# Patient Record
Sex: Male | Born: 1970 | Hispanic: Yes | Marital: Single | Smoking: Never smoker
Health system: Southern US, Community
[De-identification: ages and names within clinical notes are randomized; demographics above are authoritative.]

---

## 2006-10-20 ENCOUNTER — Emergency Department (HOSPITAL_COMMUNITY): Admission: EM | Admit: 2006-10-20 | Discharge: 2006-10-20 | Payer: Self-pay | Admitting: Emergency Medicine

## 2009-02-10 ENCOUNTER — Emergency Department (HOSPITAL_COMMUNITY): Admission: EM | Admit: 2009-02-10 | Discharge: 2009-02-10 | Payer: Self-pay | Admitting: Emergency Medicine

## 2009-02-25 ENCOUNTER — Emergency Department (HOSPITAL_COMMUNITY): Admission: EM | Admit: 2009-02-25 | Discharge: 2009-02-25 | Payer: Self-pay | Admitting: Family Medicine

## 2010-02-01 ENCOUNTER — Emergency Department (HOSPITAL_COMMUNITY): Admission: EM | Admit: 2010-02-01 | Discharge: 2010-02-01 | Payer: Self-pay | Admitting: Emergency Medicine

## 2010-04-02 ENCOUNTER — Ambulatory Visit (HOSPITAL_COMMUNITY): Admission: RE | Admit: 2010-04-02 | Discharge: 2010-04-02 | Payer: Self-pay | Admitting: Chiropractic Medicine

## 2010-12-07 LAB — POCT I-STAT, CHEM 8
Calcium, Ion: 1.15 mmol/L (ref 1.12–1.32)
Chloride: 104 mEq/L (ref 96–112)
TCO2: 25 mmol/L (ref 0–100)

## 2010-12-07 LAB — URINALYSIS, ROUTINE W REFLEX MICROSCOPIC
Bilirubin Urine: NEGATIVE
Glucose, UA: NEGATIVE mg/dL
Hgb urine dipstick: NEGATIVE
Ketones, ur: NEGATIVE mg/dL
Nitrite: NEGATIVE
Specific Gravity, Urine: 1.015 (ref 1.005–1.030)
Urobilinogen, UA: 1 mg/dL (ref 0.0–1.0)

## 2014-02-26 ENCOUNTER — Emergency Department (HOSPITAL_COMMUNITY): Payer: Self-pay

## 2014-02-26 ENCOUNTER — Emergency Department (HOSPITAL_COMMUNITY)
Admission: EM | Admit: 2014-02-26 | Discharge: 2014-02-26 | Disposition: A | Discharge status: Home / Self Care | Payer: Self-pay | Attending: Emergency Medicine | Admitting: Emergency Medicine

## 2014-02-26 ENCOUNTER — Encounter (HOSPITAL_COMMUNITY): Payer: Self-pay | Admitting: Emergency Medicine

## 2014-02-26 DIAGNOSIS — S1093XA Contusion of unspecified part of neck, initial encounter: Principal | ICD-10-CM

## 2014-02-26 DIAGNOSIS — Z79899 Other long term (current) drug therapy: Secondary | ICD-10-CM | POA: Insufficient documentation

## 2014-02-26 DIAGNOSIS — Y92410 Unspecified street and highway as the place of occurrence of the external cause: Secondary | ICD-10-CM | POA: Insufficient documentation

## 2014-02-26 DIAGNOSIS — S0083XA Contusion of other part of head, initial encounter: Principal | ICD-10-CM | POA: Insufficient documentation

## 2014-02-26 DIAGNOSIS — Y9389 Activity, other specified: Secondary | ICD-10-CM | POA: Insufficient documentation

## 2014-02-26 DIAGNOSIS — S0003XA Contusion of scalp, initial encounter: Secondary | ICD-10-CM | POA: Insufficient documentation

## 2014-02-26 MED ORDER — IBUPROFEN 600 MG PO TABS: 600.0000 mg | ORAL_TABLET | Freq: Three times a day (TID) | ORAL | Status: AC

## 2014-02-26 MED ORDER — TRAMADOL HCL 50 MG PO TABS: 50.0000 mg | ORAL_TABLET | Freq: Four times a day (QID) | ORAL | Status: DC | PRN

## 2014-02-26 NOTE — ED Notes (Signed)
Patient states that h is head, neck and back is hurting. Pain level is 5

## 2014-02-26 NOTE — ED Notes (Signed)
C collar off by MD.  
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
## 2016-07-28 ENCOUNTER — Encounter (HOSPITAL_COMMUNITY): Payer: Self-pay | Admitting: *Deleted

## 2016-07-28 ENCOUNTER — Emergency Department (HOSPITAL_COMMUNITY): Payer: Self-pay

## 2016-07-28 DIAGNOSIS — R0602 Shortness of breath: Secondary | ICD-10-CM | POA: Insufficient documentation

## 2016-07-28 DIAGNOSIS — J189 Pneumonia, unspecified organism: Secondary | ICD-10-CM | POA: Insufficient documentation

## 2016-07-28 LAB — CBC
HEMATOCRIT: 42.6 % (ref 39.0–52.0)
Hemoglobin: 14.6 g/dL (ref 13.0–17.0)
MCH: 29.7 pg (ref 26.0–34.0)
MCHC: 34.3 g/dL (ref 30.0–36.0)
MCV: 86.8 fL (ref 78.0–100.0)
PLATELETS: 267 10*3/uL (ref 150–400)
RBC: 4.91 MIL/uL (ref 4.22–5.81)
RDW: 13.4 % (ref 11.5–15.5)
WBC: 11.6 10*3/uL — AB (ref 4.0–10.5)

## 2016-07-28 LAB — I-STAT TROPONIN, ED: Troponin i, poc: 0 ng/mL (ref 0.00–0.08)

## 2016-07-28 LAB — BASIC METABOLIC PANEL
Anion gap: 7 (ref 5–15)
BUN: 12 mg/dL (ref 6–20)
CHLORIDE: 104 mmol/L (ref 101–111)
CO2: 26 mmol/L (ref 22–32)
CREATININE: 0.84 mg/dL (ref 0.61–1.24)
Calcium: 9.1 mg/dL (ref 8.9–10.3)
Glucose, Bld: 105 mg/dL — ABNORMAL HIGH (ref 65–99)
POTASSIUM: 3.8 mmol/L (ref 3.5–5.1)
SODIUM: 137 mmol/L (ref 135–145)

## 2016-07-28 NOTE — ED Triage Notes (Signed)
Pt c/o chest pain, productive cough , and headache x 2 weeks. Reports pain worsens at night. Also reports fever and chills

## 2016-07-29 ENCOUNTER — Emergency Department (HOSPITAL_COMMUNITY)
Admission: EM | Admit: 2016-07-29 | Discharge: 2016-07-29 | Disposition: A | Discharge status: Home / Self Care | Payer: Self-pay | Attending: Emergency Medicine | Admitting: Emergency Medicine

## 2016-07-29 DIAGNOSIS — R05 Cough: Secondary | ICD-10-CM

## 2016-07-29 DIAGNOSIS — R059 Cough, unspecified: Secondary | ICD-10-CM

## 2016-07-29 DIAGNOSIS — J189 Pneumonia, unspecified organism: Secondary | ICD-10-CM

## 2016-07-29 LAB — BRAIN NATRIURETIC PEPTIDE: B NATRIURETIC PEPTIDE 5: 9.2 pg/mL (ref 0.0–100.0)

## 2016-07-29 MED ORDER — IPRATROPIUM BROMIDE 0.02 % IN SOLN
0.5000 mg | Freq: Once | RESPIRATORY_TRACT | Status: AC
  Administered 2016-07-29: 0.5 mg via RESPIRATORY_TRACT
  Filled 2016-07-29: qty 2.5

## 2016-07-29 MED ORDER — BENZONATATE 100 MG PO CAPS: 100.0000 mg | ORAL_CAPSULE | Freq: Three times a day (TID) | ORAL | 0 refills | Status: AC

## 2016-07-29 MED ORDER — ALBUTEROL SULFATE (2.5 MG/3ML) 0.083% IN NEBU
5.0000 mg | INHALATION_SOLUTION | Freq: Once | RESPIRATORY_TRACT | Status: AC
  Administered 2016-07-29: 5 mg via RESPIRATORY_TRACT
  Filled 2016-07-29: qty 6

## 2016-07-29 MED ORDER — ALBUTEROL SULFATE HFA 108 (90 BASE) MCG/ACT IN AERS
2.0000 | INHALATION_SPRAY | RESPIRATORY_TRACT | Status: DC | PRN
  Administered 2016-07-29: 2 via RESPIRATORY_TRACT
  Filled 2016-07-29: qty 6.7

## 2016-07-29 MED ORDER — AZITHROMYCIN 250 MG PO TABS: 250.0000 mg | ORAL_TABLET | Freq: Every day | ORAL | 0 refills | Status: AC

## 2016-07-29 MED ORDER — DEXAMETHASONE 10 MG/ML FOR PEDIATRIC ORAL USE
10.0000 mg | Freq: Once | INTRAMUSCULAR | Status: AC
  Administered 2016-07-29: 10 mg via ORAL
  Filled 2016-07-29: qty 1

## 2016-07-29 MED ORDER — AZITHROMYCIN 250 MG PO TABS
500.0000 mg | ORAL_TABLET | Freq: Once | ORAL | Status: AC
  Administered 2016-07-29: 500 mg via ORAL
  Filled 2016-07-29: qty 2

## 2016-07-29 MED ORDER — FLUTICASONE PROPIONATE 50 MCG/ACT NA SUSP
2.0000 | Freq: Once | NASAL | Status: AC
  Administered 2016-07-29: 2 via NASAL
  Filled 2016-07-29: qty 16

## 2016-07-29 MED ORDER — AEROCHAMBER PLUS W/MASK MISC
1.0000 | Freq: Once | Status: AC
  Administered 2016-07-29: 1
  Filled 2016-07-29: qty 1

## 2016-07-29 NOTE — ED Notes (Signed)
Pt given extended instructions regarding aero chamber and MDI usage.

## 2016-07-29 NOTE — Discharge Instructions (Signed)
1. Medications: flonase, OTC mucinex, tessalon, albuterol, usual home medications °2. Treatment: rest, drink plenty of fluids, take tylenol or ibuprofen for fever control °3. Follow Up: Please followup with your primary doctor in 3 days for discussion of your diagnoses and further evaluation after today's visit; if you do not have a primary care doctor use the resource guide provided to find one; Return to the ER for high fevers, difficulty breathing or other concerning symptoms ° °

## 2016-07-29 NOTE — ED Provider Notes (Signed)
[facility] Provider Note   CSN: [id number] Arrival date & time: 07/28/16  2010     History   Chief Complaint Chief Complaint  Patient presents with  . Fever  . Chest Pain  . Cough    HPI Billy Short is a 45 y.o. male with no major medical problems presents to the Emergency Department complaining of gradual, persistent, progressively worsening cough onset 3 weeks ago. Associated symptoms include subjective fevers, SOB, mild DOE related to increased cough, CP, generalized headache.  Pt denies leg swelling, recent travel, hx of DVT, surgery or fracture.  No known sick contacts.  No treatments PTA.  Pt is a never smoker.  Nothing makes it better and walking, coughing makes it worse.  Pt denies neck pain, neck stiffness, vision changes, abd pain, N/V/D, dysuria, hematuria, syncope, numbness, tingling.      The history is provided by the patient and medical records. No language interpreter was used.    History reviewed. No pertinent past medical history.  There are no active problems to display for this patient.   History reviewed. No pertinent surgical history.     Home Medications    Prior to Admission medications   Medication Sig Start Date End Date Taking? Authorizing Provider  azithromycin (ZITHROMAX) 250 MG tablet Take 1 tablet (250 mg total) by mouth daily. Take first 2 tablets together, then 1 every day until finished. 07/29/16   Sayaka Hoeppner, PA-C  benzonatate (TESSALON) 100 MG capsule Take 1 capsule (100 mg total) by mouth every 8 (eight) hours. 07/29/16   Hisashi Amadon, PA-C    Family History No family history on file.  Social History Social History  Substance Use Topics  . Smoking status: Never Smoker  . Smokeless tobacco: Never Used  . Alcohol use No     Allergies   Patient has no known allergies.   Review of Systems Review of Systems  Constitutional: Positive for fever.  HENT: Positive for congestion, rhinorrhea and  sinus pressure.   Eyes: Negative for visual disturbance.  Respiratory: Positive for cough and shortness of breath.   Cardiovascular: Positive for chest pain. Negative for palpitations and leg swelling.  Neurological: Positive for headaches.  All other systems reviewed and are negative.    Physical Exam Updated Vital Signs BP (!) 96/47 (BP Location: Right Arm)   Pulse 68   Temp 98 F (36.7 C) (Oral)   Resp 15   SpO2 99%   Physical Exam  Constitutional: He appears well-developed and well-nourished. No distress.  Awake, alert, nontoxic appearance  HENT:  Head: Normocephalic and atraumatic.  Right Ear: Tympanic membrane, external ear and ear canal normal.  Left Ear: Tympanic membrane, external ear and ear canal normal.  Nose: Mucosal edema and rhinorrhea present. No epistaxis. Right sinus exhibits no maxillary sinus tenderness and no frontal sinus tenderness. Left sinus exhibits no maxillary sinus tenderness and no frontal sinus tenderness.  Mouth/Throat: Uvula is midline, oropharynx is clear and moist and mucous membranes are normal. Mucous membranes are not pale and not cyanotic. No oropharyngeal exudate, posterior oropharyngeal edema, posterior oropharyngeal erythema or tonsillar abscesses.  Eyes: Conjunctivae are normal. Pupils are equal, round, and reactive to light. No scleral icterus.  Neck: Normal range of motion and full passive range of motion without pain. Neck supple.  Cardiovascular: Normal rate, regular rhythm and intact distal pulses.   Pulmonary/Chest: Effort normal and breath sounds normal. No stridor. No respiratory distress. He has no wheezes.  Clear and equal breath  sounds without focal wheezes, rhonchi, rales  Abdominal: Soft. Bowel sounds are normal. He exhibits no mass. There is no tenderness. There is no rebound and no guarding.  Musculoskeletal: Normal range of motion. He exhibits no edema.  Lymphadenopathy:    He has no cervical adenopathy.  Neurological: He  is alert.  Speech is clear and goal oriented Moves extremities without ataxia  Skin: Skin is warm and dry. No rash noted. He is not diaphoretic.  Psychiatric: He has a normal mood and affect.  Nursing note and vitals reviewed.    ED Treatments / Results  Labs (all labs ordered are listed, but only abnormal results are displayed) Labs Reviewed  BASIC METABOLIC PANEL - Abnormal; Notable for the following:       Result Value   Glucose, Bld 105 (*)    All other components within normal limits  CBC - Abnormal; Notable for the following:    WBC 11.6 (*)    All other components within normal limits  BRAIN NATRIURETIC PEPTIDE  Rosezena Sensor, ED     Radiology Dg Chest 2 View  Result Date: 07/28/2016 CLINICAL DATA:  Subacute onset of cough and fever. Initial encounter. EXAM: CHEST  2 VIEW COMPARISON:  Chest radiograph performed 02/01/2010 FINDINGS: The lungs are well-aerated. Vascular congestion is noted. Increased interstitial markings raise concern for mild interstitial edema. There is no evidence of pleural effusion or pneumothorax. The heart is borderline normal in size. No acute osseous abnormalities are seen. IMPRESSION: Vascular congestion noted. Increased interstitial markings raise concern for mild interstitial edema. Electronically Signed   By: Roanna Raider M.D.   On: 07/28/2016 22:19    Procedures Procedures (including critical care time)  Medications Ordered in ED Medications  albuterol (PROVENTIL HFA;VENTOLIN HFA) 108 (90 Base) MCG/ACT inhaler 2 puff (2 puffs Inhalation Given 07/29/16 0540)  albuterol (PROVENTIL) (2.5 MG/3ML) 0.083% nebulizer solution 5 mg (5 mg Nebulization Given 07/29/16 0321)  ipratropium (ATROVENT) nebulizer solution 0.5 mg (0.5 mg Nebulization Given 07/29/16 0321)  fluticasone (FLONASE) 50 MCG/ACT nasal spray 2 spray (2 sprays Each Nare Given 07/29/16 0325)  dexamethasone (DECADRON) 10 MG/ML injection for Pediatric ORAL use 10 mg (10 mg Oral  Given 07/29/16 0325)  azithromycin (ZITHROMAX) tablet 500 mg (500 mg Oral Given 07/29/16 0321)  aerochamber plus with mask device 1 each (1 each Other Given 07/29/16 0540)     Initial Impression / Assessment and Plan / ED Course  I have reviewed the triage vital signs and the nursing notes.  Pertinent labs & imaging results that were available during my care of the patient were reviewed by me and considered in my medical decision making (see chart for details).  Clinical Course as of Jul 29 630  Thu Jul 29, 2016  0622 Mild leukocytosis WBC: (!) 11.6 [HM]  0622 Troponin i, poc: 0.00 [HM]  0622 Within normal limits B Natriuretic Peptide: 9.2 [HM]  0622 No evidence of pneumonia DG Chest 2 View [HM]    Clinical Course User Index [HM] Ashlee Player, PA-C    Pt CXR negative for acute infiltrate, However symptoms are consistent with pneumonia acquired pneumonia. Will treat with azithromycin. Significant improvement in breathing after albuterol treatment.  No hypoxia. Patient well-appearing.. Pt will be discharged with symptomatic treatment.  Verbalizes understanding and is agreeable with plan. Pt is hemodynamically stable & in NAD prior to dc.   Final Clinical Impressions(s) / ED Diagnoses   Final diagnoses:  Cough  Community acquired pneumonia, unspecified laterality  New Prescriptions Discharge Medication List as of 07/29/2016  5:17 AM    START taking these medications   Details  azithromycin (ZITHROMAX) 250 MG tablet Take 1 tablet (250 mg total) by mouth daily. Take first 2 tablets together, then 1 every day until finished., Starting Thu 07/29/2016, Print    benzonatate (TESSALON) 100 MG capsule Take 1 capsule (100 mg total) by mouth every 8 (eight) hours., Starting Thu 07/29/2016, [employer]         Kilea Mccarey, PA-C 07/29/16 [id number]    Tomasita CrumbleAdeleke Oni, MD 07/29/16 [id number]

## 2017-09-19 IMAGING — DX DG CHEST 2V
2 series · 2 of 2 positions shown · non-contrast
Comparison: Chest radiograph performed 02/01/2010

CLINICAL DATA: Subacute onset of cough and fever. Initial
encounter.

EXAM:
CHEST  2 VIEW

[w chest pa]
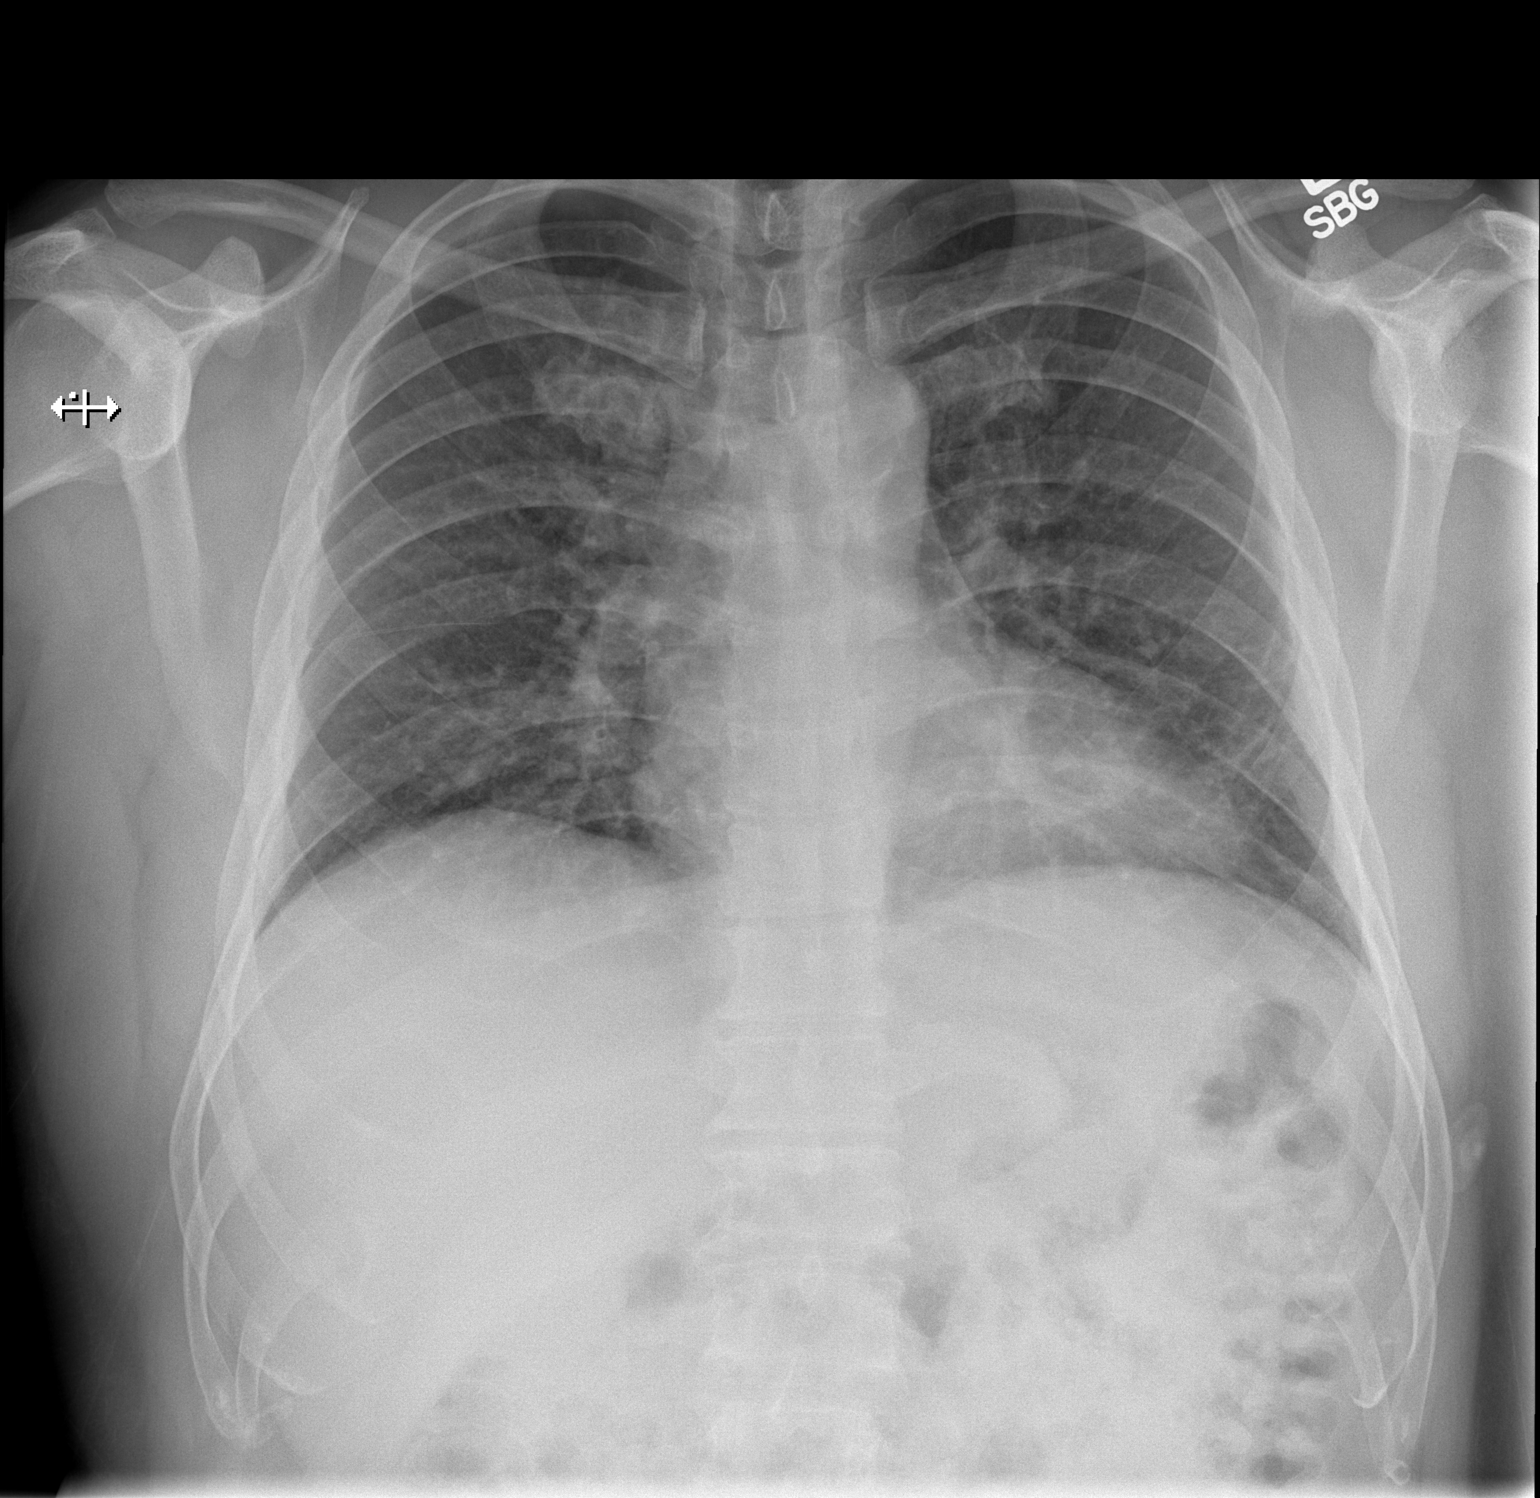

[w chest lat]
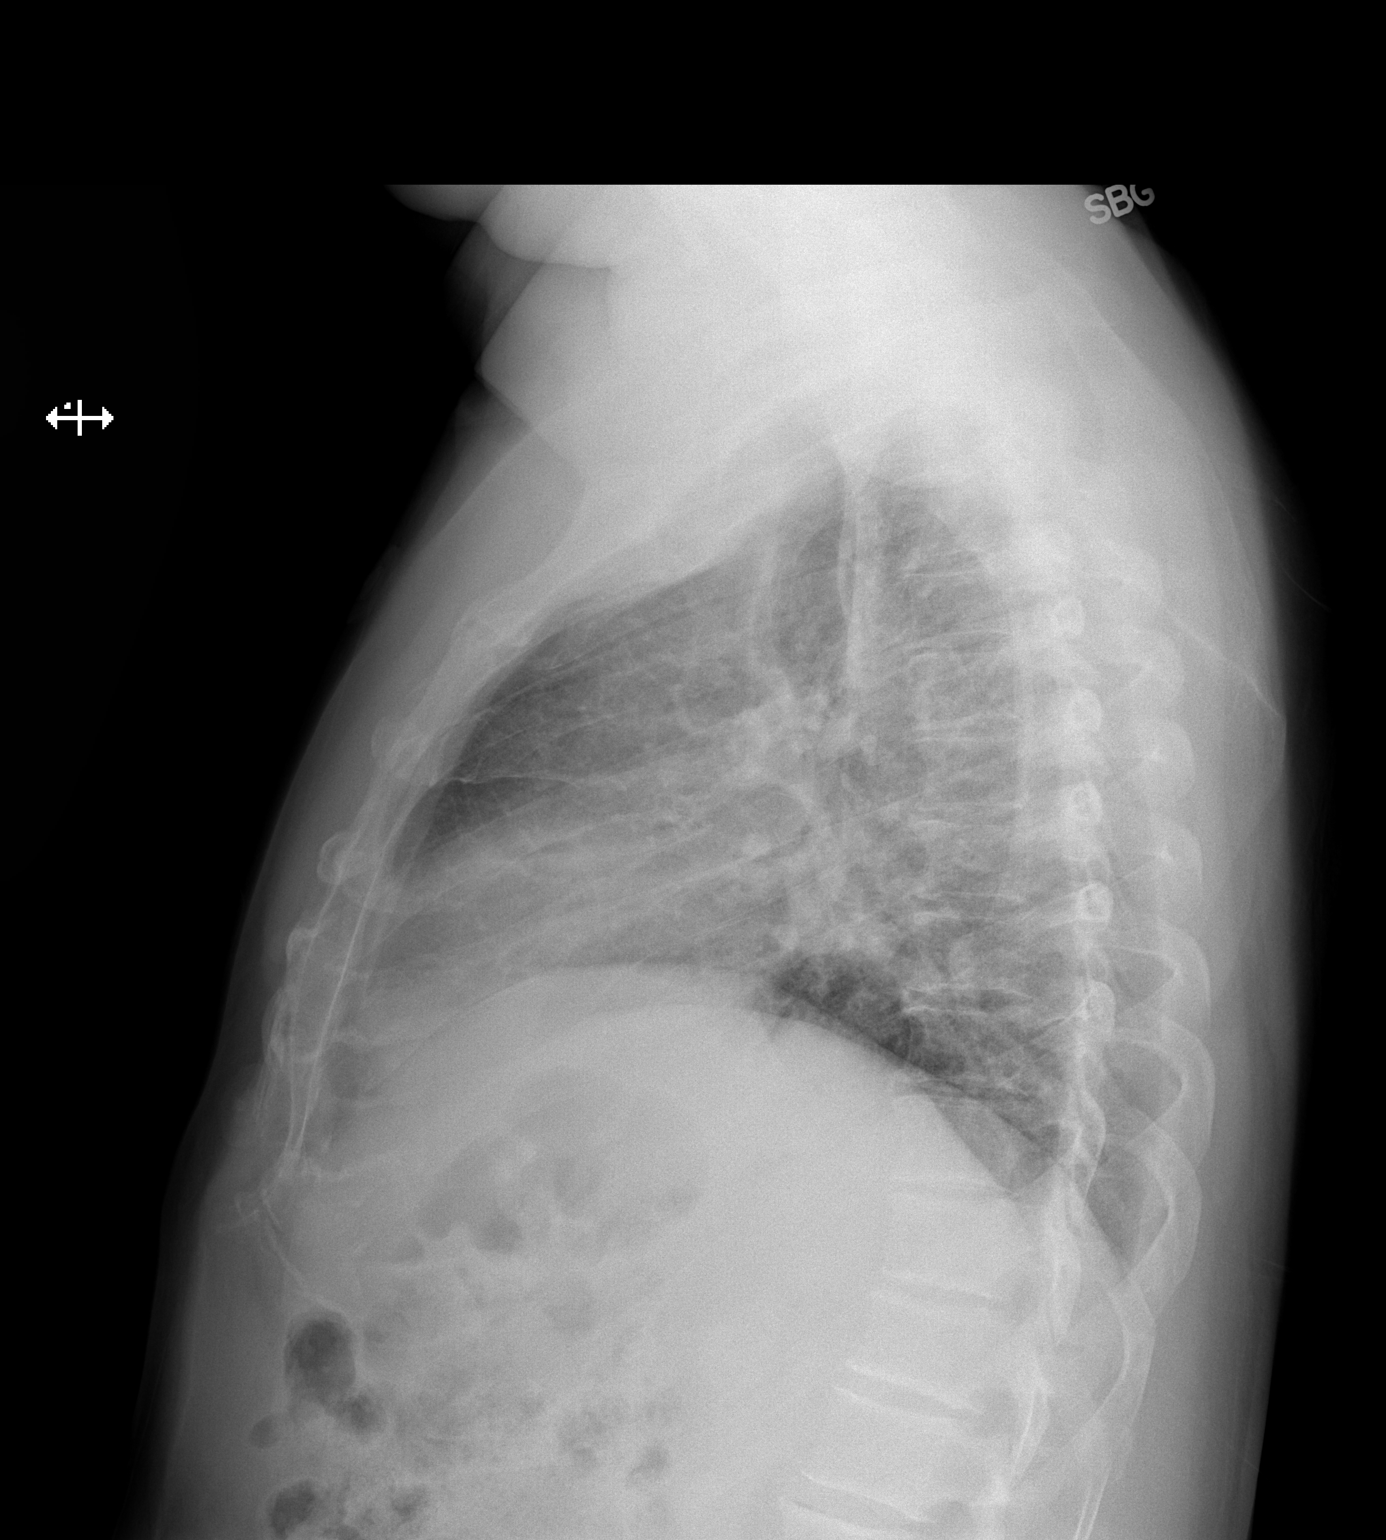

[2 of 2 positions shown; findings below may reference images not displayed]

FINDINGS: The lungs are well-aerated. Vascular congestion is noted. Increased
interstitial markings raise concern for mild interstitial edema.
There is no evidence of pleural effusion or pneumothorax.

The heart is borderline normal in size. No acute osseous
abnormalities are seen.
IMPRESSION: Vascular congestion noted. Increased interstitial markings raise
concern for mild interstitial edema.

## 2018-10-27 ENCOUNTER — Other Ambulatory Visit: Payer: Self-pay

## 2018-10-27 ENCOUNTER — Ambulatory Visit (HOSPITAL_COMMUNITY)
Admission: EM | Admit: 2018-10-27 | Discharge: 2018-10-27 | Disposition: A | Discharge status: Home / Self Care | Payer: Self-pay | Attending: Family Medicine | Admitting: Family Medicine

## 2018-10-27 DIAGNOSIS — H5711 Ocular pain, right eye: Secondary | ICD-10-CM

## 2018-10-27 DIAGNOSIS — S0501XA Injury of conjunctiva and corneal abrasion without foreign body, right eye, initial encounter: Secondary | ICD-10-CM

## 2018-10-27 DIAGNOSIS — R21 Rash and other nonspecific skin eruption: Secondary | ICD-10-CM

## 2018-10-27 MED ORDER — POLYMYXIN B-TRIMETHOPRIM 10000-0.1 UNIT/ML-% OP SOLN: 2.0000 [drp] | OPHTHALMIC | 0 refills | Status: AC

## 2018-10-27 MED ORDER — FLUORESCEIN SODIUM 1 MG OP STRP
ORAL_STRIP | OPHTHALMIC | Status: AC
  Filled 2018-10-27: qty 1

## 2018-10-27 MED ORDER — PREDNISONE 20 MG PO TABS: 40.0000 mg | ORAL_TABLET | Freq: Every day | ORAL | 0 refills | Status: AC

## 2018-10-27 MED ORDER — PREDNISOLONE ACETATE 0.12 % OP SUSP: 1.0000 [drp] | Freq: Four times a day (QID) | OPHTHALMIC | 0 refills | Status: AC

## 2018-10-27 MED ORDER — TETRACAINE HCL 0.5 % OP SOLN
OPHTHALMIC | Status: AC
  Filled 2018-10-27: qty 4

## 2018-10-27 NOTE — ED Provider Notes (Signed)
[facility]    CSN: [id number] Arrival date & time: 10/27/18  1717     History   Chief Complaint Chief Complaint  Patient presents with  . Eye Pain    HPI Billy Short is a 48 y.o. male.   HPI  Patient complains of right eye and right sided facial pain. Onset of symptoms occurred today. Noticed pain upon awakening today. Eye pain has worsened throughout the course of the day. He denies injury or know foreign body. He works as a Education administrator. He has a red rash on face unknown of when rash began. He has not attempted relief with any medication. He has not eaten or ingested anything new. He is not having eye itching. Endorses the sensation that something is present within the eye although denies visual changes. No recent illness. He is not having crusting or drainage from his right eye. Denies any sinus symptoms or headaches. No past medical history on file.  There are no active problems to display for this patient.   No past surgical history on file.     Home Medications    Prior to Admission medications   Medication Sig Start Date End Date Taking? Authorizing Provider  azithromycin (ZITHROMAX) 250 MG tablet Take 1 tablet (250 mg total) by mouth daily. Take first 2 tablets together, then 1 every day until finished. 07/29/16   Muthersbaugh, Dahlia Client, PA-C  benzonatate (TESSALON) 100 MG capsule Take 1 capsule (100 mg total) by mouth every 8 (eight) hours. 07/29/16   Muthersbaugh, Dahlia Client, PA-C    Family History No family history on file.  Social History Social History   Tobacco Use  . Smoking status: Never Smoker  . Smokeless tobacco: Never Used  Substance Use Topics  . Alcohol use: No  . Drug use: No     Allergies   Patient has no known allergies.   Review of Systems Review of Systems Pertinent negatives listed in HPI Physical Exam Triage Vital Signs ED Triage Vitals  Enc Vitals Group     BP 10/27/18 1757 (!) 128/92     Pulse Rate 10/27/18 1757 72      Resp 10/27/18 1757 16     Temp 10/27/18 1757 97.6 F (36.4 C)     Temp Source 10/27/18 1757 Temporal     SpO2 10/27/18 1757 97 %     Weight 10/27/18 1758 165 lb (74.8 kg)     Height 10/27/18 1758 5\' 5"  (1.651 m)     Head Circumference --      Peak Flow --      Pain Score 10/27/18 1758 10     Pain Loc --      Pain Edu? --      Excl. in GC? --    No data found.  Updated Vital Signs BP (!) 128/92 (BP Location: Right Arm)   Pulse 72   Temp 97.6 F (36.4 C) (Temporal)   Resp 16   Ht 5\' 5"  (1.651 m)   Wt 165 lb (74.8 kg)   SpO2 97%   BMI 27.46 kg/m   Visual Acuity Right Eye Distance:   Left Eye Distance:   Bilateral Distance:    Right Eye Near:   Left Eye Near:    Bilateral Near:     Physical Exam   UC Treatments / Results  Labs (all labs ordered are listed, but only abnormal results are displayed) Labs Reviewed - No data to display General appearance: alert, well developed, well nourished,  cooperative and in no distress Head: Normocephalic, without obvious abnormality, atraumatic Eyes: Fluorescein staining performed to right eye. Uptake occurred. Respiratory: Respirations even and unlabored, normal respiratory rate Extremities: No gross deformities Skin: macular rash forehead , right maxillary region and chin. Face sightly edematous. Psych: Appropriate mood and affect. Neurologic: Mental status: Alert, oriented to person, place, and time, thought content appropriate. EKG None  Radiology No results found.  Procedures Procedures (including critical care time)  Medications Ordered in UC Medications - No data to display  Initial Impression / Assessment and Plan / UC Course  I have reviewed the triage vital signs and the nursing notes.  Pertinent labs & imaging results that were available during my care of the patient were reviewed by me and considered in my medical decision making (see chart for details).    Patient presents today with a complaint of  eye pain and facial pain. Face appears mildly edematous on right side with a macular type rash.  Fluorescein staining of the right eye revealed uptake increasing concern for likely corneal abrasion. He also has a macular rash present of unknown etiology. Will start patient on antibiotic eye drops and prednisone eye drops for pain. Patient given information to follow-up with ophthalmologist tomorrow. Will treat rash with a short course of oral prednisone. Patient verbalized understanding and agreement with plan.  Final Clinical Impressions(s) / UC Diagnoses   Final diagnoses:  Abrasion of right cornea, initial encounter  Rash and nonspecific skin eruption  Pain of right eye     Discharge Instructions      Call tomorrow to schedule an appointment: [facility]  Address: [address] [place], [place], [place] [address] Phone: [phone] Hours: Closed  Opens tomorrow 9 AM    ED Prescriptions    Medication Sig Dispense Auth. Provider   prednisoLONE acetate (PRED MILD) 0.12 % ophthalmic suspension Place 1 drop into the right eye 4 (four) times daily. 5 mL Bing Neighbors, FNP   trimethoprim-polymyxin b (POLYTRIM) ophthalmic solution Place 2 drops into the right eye every 4 (four) hours. 10 mL Bing Neighbors, FNP   predniSONE (DELTASONE) 20 MG tablet Take 2 tablets (40 mg total) by mouth daily with breakfast for 5 days. 10 tablet Bing Neighbors, FNP     Controlled Substance Prescriptions [place] Controlled Substance Registry consulted? Not Applicable   Bing Neighbors, FNP 10/28/18 [phone]

## 2018-10-27 NOTE — ED Triage Notes (Signed)
Per pt he has been having right eye pain with some facial pain for about 1 week. No obvious swelling or redness to face or eye. Pt stated it was tender to touch in the area. No head injury or trauma to face.

## 2018-10-27 NOTE — Discharge Instructions (Signed)
°  Call tomorrow to schedule an appointment: [facility]  Address: [address] [place], [place], [place] [address] Phone: [phone] Hours: Closed  Opens tomorrow 9 AM

## 2023-02-25 NOTE — Progress Notes (Unsigned)
Erroneous encounter-disregard

## 2023-03-01 ENCOUNTER — Encounter: Payer: Self-pay | Admitting: Family

## 2023-03-01 DIAGNOSIS — Z758 Other problems related to medical facilities and other health care: Secondary | ICD-10-CM

## 2023-03-01 DIAGNOSIS — Z7689 Persons encountering health services in other specified circumstances: Secondary | ICD-10-CM
